# Patient Record
Sex: Female | Born: 1935 | Race: White | Hispanic: No
Health system: Southern US, Community
[De-identification: ages and names within clinical notes are randomized; demographics above are authoritative.]

---

## 2006-09-10 ENCOUNTER — Other Ambulatory Visit: Payer: Self-pay

## 2006-09-10 ENCOUNTER — Inpatient Hospital Stay: Payer: Self-pay | Admitting: Internal Medicine

## 2007-01-21 ENCOUNTER — Ambulatory Visit: Payer: Self-pay | Admitting: Family Medicine

## 2007-04-29 ENCOUNTER — Emergency Department: Payer: Self-pay | Admitting: Emergency Medicine

## 2007-04-29 ENCOUNTER — Other Ambulatory Visit: Payer: Self-pay

## 2007-05-07 ENCOUNTER — Other Ambulatory Visit: Payer: Self-pay

## 2007-05-07 ENCOUNTER — Emergency Department: Payer: Self-pay | Admitting: Emergency Medicine

## 2007-05-20 ENCOUNTER — Emergency Department: Payer: Self-pay | Admitting: Emergency Medicine

## 2007-05-20 ENCOUNTER — Other Ambulatory Visit: Payer: Self-pay

## 2007-07-11 ENCOUNTER — Ambulatory Visit: Payer: Self-pay | Admitting: Emergency Medicine

## 2007-12-03 ENCOUNTER — Ambulatory Visit: Payer: Self-pay | Admitting: Vascular Surgery

## 2008-02-29 ENCOUNTER — Ambulatory Visit: Payer: Self-pay | Admitting: Family Medicine

## 2008-05-02 ENCOUNTER — Ambulatory Visit: Payer: Self-pay | Admitting: Internal Medicine

## 2008-09-02 ENCOUNTER — Ambulatory Visit: Payer: Self-pay | Admitting: Internal Medicine

## 2009-12-18 ENCOUNTER — Ambulatory Visit: Payer: Self-pay | Admitting: Vascular Surgery

## 2011-08-29 ENCOUNTER — Ambulatory Visit: Payer: Self-pay | Admitting: Vascular Surgery

## 2011-08-29 LAB — CREATININE, SERUM
Creatinine: 0.88 mg/dL (ref 0.60–1.30)
EGFR (African American): >60
EGFR (Non-African Amer.): >60

## 2014-01-03 ENCOUNTER — Telehealth: Payer: Self-pay

## 2014-01-03 NOTE — Telephone Encounter (Signed)
Pt's daughter called in regards to a hospital bed for pt.

## 2014-01-05 NOTE — Telephone Encounter (Signed)
This was entered into the incorrect Larey Dresserellie Taunton' chart in error.

## 2014-01-06 NOTE — Telephone Encounter (Signed)
OK to write order for hospital bed for patient.  Dx: low back pain with DDD lumbar spine, deconditioning, frequent falls, peripheral neuropathy.  Where do we need to send order?  [facility]?

## 2014-01-08 ENCOUNTER — Telehealth: Payer: Self-pay | Admitting: Radiology

## 2014-01-08 NOTE — Telephone Encounter (Signed)
error 

## 2014-01-08 NOTE — Telephone Encounter (Signed)
See other phone message  

## 2014-07-07 ENCOUNTER — Ambulatory Visit: Payer: Self-pay | Admitting: Hospice and Palliative Medicine

## 2014-07-26 LAB — COMPREHENSIVE METABOLIC PANEL
AST: 51 U/L — AB (ref 15–37)
Albumin: 3.4 g/dL (ref 3.4–5.0)
Alkaline Phosphatase: 119 U/L — ABNORMAL HIGH
Anion Gap: 9 (ref 7–16)
BUN: 35 mg/dL — AB (ref 7–18)
Bilirubin,Total: 0.4 mg/dL (ref 0.2–1.0)
CALCIUM: 9.5 mg/dL (ref 8.5–10.1)
Chloride: 112 mmol/L — ABNORMAL HIGH (ref 98–107)
Co2: 26 mmol/L (ref 21–32)
Creatinine: 1.7 mg/dL — ABNORMAL HIGH (ref 0.60–1.30)
GFR CALC AF AMER: 36 — AB
GFR CALC NON AF AMER: 30 — AB
GLUCOSE: 205 mg/dL — AB (ref 65–99)
OSMOLALITY: 306 (ref 275–301)
POTASSIUM: 4.4 mmol/L (ref 3.5–5.1)
SGPT (ALT): 34 U/L
Sodium: 147 mmol/L — ABNORMAL HIGH (ref 136–145)
Total Protein: 7.2 g/dL (ref 6.4–8.2)

## 2014-07-26 LAB — CBC
HCT: 49.7 % — ABNORMAL HIGH (ref 35.0–47.0)
HGB: 15.6 g/dL (ref 12.0–16.0)
MCH: 30.3 pg (ref 26.0–34.0)
MCHC: 31.3 g/dL — ABNORMAL LOW (ref 32.0–36.0)
MCV: 97 fL (ref 80–100)
PLATELETS: 217 10*3/uL (ref 150–440)
RBC: 5.13 10*6/uL (ref 3.80–5.20)
RDW: 14.4 % (ref 11.5–14.5)
WBC: 15.7 10*3/uL — AB (ref 3.6–11.0)

## 2014-07-26 LAB — URINALYSIS, COMPLETE
BLOOD: NEGATIVE
Bilirubin,UR: NEGATIVE
Glucose,UR: NEGATIVE mg/dL (ref 0–75)
KETONE: NEGATIVE
Leukocyte Esterase: NEGATIVE
NITRITE: NEGATIVE
PH: 6 (ref 4.5–8.0)
RBC,UR: NONE SEEN /HPF (ref 0–5)
Specific Gravity: 1.018 (ref 1.003–1.030)
Squamous Epithelial: 1

## 2014-07-26 LAB — LIPASE, BLOOD: LIPASE: 410 U/L — AB (ref 73–393)

## 2014-07-27 ENCOUNTER — Inpatient Hospital Stay: Payer: Self-pay | Admitting: Internal Medicine

## 2014-07-27 LAB — CLOSTRIDIUM DIFFICILE(ARMC)

## 2014-07-28 LAB — CBC WITH DIFFERENTIAL/PLATELET
BASOS PCT: 0.4 %
Basophil #: 0 10*3/uL (ref 0.0–0.1)
EOS ABS: 0 10*3/uL (ref 0.0–0.7)
EOS PCT: 0.6 %
HCT: 37.2 % (ref 35.0–47.0)
HGB: 11.9 g/dL — AB (ref 12.0–16.0)
LYMPHS PCT: 16.3 %
Lymphocyte #: 1.1 10*3/uL (ref 1.0–3.6)
MCH: 30.4 pg (ref 26.0–34.0)
MCHC: 31.9 g/dL — ABNORMAL LOW (ref 32.0–36.0)
MCV: 95 fL (ref 80–100)
Monocyte #: 0.5 x10 3/mm (ref 0.2–0.9)
Monocyte %: 7.1 %
NEUTROS PCT: 75.6 %
Neutrophil #: 5.2 10*3/uL (ref 1.4–6.5)
Platelet: 128 10*3/uL — ABNORMAL LOW (ref 150–440)
RBC: 3.9 10*6/uL (ref 3.80–5.20)
RDW: 14.3 % (ref 11.5–14.5)
WBC: 6.8 10*3/uL (ref 3.6–11.0)

## 2014-07-28 LAB — COMPREHENSIVE METABOLIC PANEL
ALBUMIN: 2.4 g/dL — AB (ref 3.4–5.0)
ALK PHOS: 77 U/L
Anion Gap: 7 (ref 7–16)
BUN: 33 mg/dL — AB (ref 7–18)
Bilirubin,Total: 0.2 mg/dL (ref 0.2–1.0)
CREATININE: 1.05 mg/dL (ref 0.60–1.30)
Calcium, Total: 8.2 mg/dL — ABNORMAL LOW (ref 8.5–10.1)
Chloride: 114 mmol/L — ABNORMAL HIGH (ref 98–107)
Co2: 25 mmol/L (ref 21–32)
EGFR (African American): >60
EGFR (Non-African Amer.): 52 — ABNORMAL LOW
Glucose: 69 mg/dL (ref 65–99)
OSMOLALITY: 296 (ref 275–301)
POTASSIUM: 3.7 mmol/L (ref 3.5–5.1)
SGOT(AST): 44 U/L — ABNORMAL HIGH (ref 15–37)
SGPT (ALT): 24 U/L
Sodium: 146 mmol/L — ABNORMAL HIGH (ref 136–145)
Total Protein: 5.2 g/dL — ABNORMAL LOW (ref 6.4–8.2)

## 2014-07-29 LAB — CREATININE, SERUM
CREATININE: 0.82 mg/dL (ref 0.60–1.30)
EGFR (African American): >60
EGFR (Non-African Amer.): >60

## 2014-07-29 LAB — BUN: BUN: 22 mg/dL — AB (ref 7–18)

## 2014-07-29 LAB — PLATELET COUNT: Platelet: 152 10*3/uL (ref 150–440)

## 2014-07-30 LAB — URINALYSIS, COMPLETE
Bilirubin,UR: NEGATIVE
Glucose,UR: NEGATIVE mg/dL (ref 0–75)
Nitrite: NEGATIVE
Ph: 5 (ref 4.5–8.0)
Protein: NEGATIVE
Specific Gravity: 1.01 (ref 1.003–1.030)
Squamous Epithelial: 1

## 2014-07-30 LAB — CBC WITH DIFFERENTIAL/PLATELET
BASOS ABS: 0 10*3/uL (ref 0.0–0.1)
Basophil %: 0.3 %
Eosinophil #: 0 10*3/uL (ref 0.0–0.7)
Eosinophil %: 0.4 %
HCT: 40.6 % (ref 35.0–47.0)
HGB: 13.2 g/dL (ref 12.0–16.0)
LYMPHS PCT: 11.1 %
Lymphocyte #: 0.9 10*3/uL — ABNORMAL LOW (ref 1.0–3.6)
MCH: 30.1 pg (ref 26.0–34.0)
MCHC: 32.4 g/dL (ref 32.0–36.0)
MCV: 93 fL (ref 80–100)
Monocyte #: 0.6 x10 3/mm (ref 0.2–0.9)
Monocyte %: 7.9 %
NEUTROS ABS: 6.1 10*3/uL (ref 1.4–6.5)
Neutrophil %: 80.3 %
Platelet: 159 10*3/uL (ref 150–440)
RBC: 4.37 10*6/uL (ref 3.80–5.20)
RDW: 13.4 % (ref 11.5–14.5)
WBC: 7.6 10*3/uL (ref 3.6–11.0)

## 2014-07-30 LAB — BASIC METABOLIC PANEL
Anion Gap: 13 (ref 7–16)
BUN: 12 mg/dL (ref 7–18)
CALCIUM: 8.7 mg/dL (ref 8.5–10.1)
CO2: 22 mmol/L (ref 21–32)
CREATININE: 0.58 mg/dL — AB (ref 0.60–1.30)
Chloride: 104 mmol/L (ref 98–107)
EGFR (African American): >60
EGFR (Non-African Amer.): >60
Glucose: 63 mg/dL — ABNORMAL LOW (ref 65–99)
Osmolality: 275 (ref 275–301)
POTASSIUM: 3.8 mmol/L (ref 3.5–5.1)
SODIUM: 139 mmol/L (ref 136–145)

## 2014-07-30 LAB — STOOL CULTURE

## 2014-08-04 LAB — URINE CULTURE

## 2014-08-07 ENCOUNTER — Ambulatory Visit: Payer: Self-pay | Admitting: Hospice and Palliative Medicine

## 2014-08-10 LAB — WBCS, STOOL

## 2014-08-20 ENCOUNTER — Ambulatory Visit: Payer: Self-pay | Admitting: Family Medicine

## 2014-11-05 NOTE — H&P (Signed)
PATIENT NAME:  Jill Oliver, Krimson L MR#:  [id number] DATE OF BIRTH:  [birth year]  DATE OF ADMISSION:  07/27/2014  REFERRING DOCTOR: Aneta MinsPhillip A. Scotty CourtStafford, MD  PRIMARY CARE PRACTITIONER: Burley SaverL. Katherine Bliss, MD  ADMITTING  DOCTOR: Crissie FiguresEdavally N. Courtnay Petrilla, MD   CHIEF COMPLAINTS:  1.  Nausea, vomiting, diarrhea of 1 day duration.  2.  Generalized weakness for the past 1 week.    HISTORY OF PRESENT ILLNESS: A 81 year old Caucasian female with a history of dementia wheelchair/bed bound on total care dependent, history of hypertension, peripheral arterial disease, osteoarthritis, hyperlipidemia, questionable COPD was brought to the Emergency Room with the complaints of nausea, vomiting, and diarrhea of 1 day duration. According to the patient's daughter and patient's granddaughter, who are with the patient at this time, the patient developed nausea, vomiting and diarrhea of 1 day duration and, of late, for the past 1 week, she has been having generalized weakness with decreased responsiveness. Hence, brought to the Emergency Room. No history of any fever. No chest pain. No shortness of breath. No cough. In the Emergency Room, the patient was evaluated by the ED physician and workup revealed elevated sodium of 147 and a creatinine of 1.7, up from baseline of 0.8. The patient underwent chest x-ray, which was negative for any acute cardiopulmonary disease. The patient also underwent a CT of the head, which was negative for any acute CNS pathology, and also underwent a CT of the abdomen and pelvis, which was negative for any acute intraabdominal pathology. Incidentally, the patient was noted to have a left breast mass of 5 cm on her left breast and also some cholelithiasis without any cholecystitis and diverticulosis without any diverticulitis. The patient has a baseline dementia and is minimally communicative and basically wheelchair/bed bound and on total care dependent. She lives at home and being cared by home health. In the  Emergency Room, the patient was also noted to have hypoxia of unknown reason and a CT of the chest negative for any acute pulmonary pathology. She carries a diagnosis of questionable COPD and the CT of the chest shows some mucous plugging. She is on oxygen supplementation and currently her oxygen saturations are maintaining around 94% with 2 L of oxygen.   PAST MEDICAL HISTORY:  1.  Dementia.  2.  Hypertension.  3.  Peripheral artery disease.  4.  Osteoarthritis.  5.  Macular degeneration.   PAST SURGICAL HISTORY: Status post appendectomy.   ALLERGIES: No known drug allergies.   FAMILY HISTORY: Not known since the patient is an adopted child.   SOCIAL HISTORY: She lives at home by herself, close to her daughter's house and she is being helped by home health nurse 24 hours a day. She is basically wheelchair/bed bound. No history of alcohol usage or substance abuse. History of smoking in the past, but quit many years ago.    HOME MEDICATIONS:  1.  Aspirin 81 mg tablet 1 tablet orally once a day.  2.  Atenolol 50 mg tablet 1 tablet orally once a day.  3.  Citracal 1 tablet orally once a day.  4.  Enalapril 20 mg tablet 2 capsules orally once a day.  5.  Felodipine 10 mg tablet extended release 1 tablet orally once a day.  6.  Hydralazine 25 mg tablet orally 2 times a day.  7.  Lutein 6 mg oral capsule 1 capsule orally once a day.  8.  Polythene glycol 3350 orally as needed for constipation.  9.  Potassium chloride  extended release 10 mEq orally 1 tablet orally once a day.  10.  Trazodone 50 mg oral tablet 1 tablet orally once a day at bedtime.  11.  Vitamin B complex once a day.   REVIEW OF SYSTEMS: Unable to obtain since the patient has baseline dementia, but according to the patient's daughter and granddaughter, who are at the patient's bedside at this time, no history of any fever. No shortness of breath. No chest pain.   PHYSICAL EXAMINATION:  VITAL SIGNS: Temperature 97.3 degrees  Fahrenheit, pulse rate 86 per minute, respirations 20 per minute, blood pressure 101/46, oxygen saturation is 93% at room air.  GENERAL: Detailed examination not possible since the patient has a baseline dementia. She is minimally communicative. She is an elderly lady. She is awake and responding  to calling her name by opening her eyes, in no acute distress, comfortably resting in the bed.  HEAD: Atraumatic, normocephalic.  EYES: Pupils are equal, reacting to light. No conjunctival pallor. No scleral icterus.  NOSE: No drainage. No lesions.  EARS: No drainage. No external lesions.  ORAL CAVITY: No mucosal lesions. No exudates. Dry oral mucous membranes.  NECK: Supple. No JVD. No thyromegaly. No carotid bruit. Range of motion of neck within normal limits.  RESPIRATORY: Bilateral air entry present. Not using accessory muscles of respiration. No rales or rhonchi.  CARDIOVASCULAR: S1, S2 regular. A systolic murmur present. Peripheral pulses equal at carotid, femoral, and pedal pulses. No peripheral edema.  GASTROINTESTINAL: Abdomen is soft and nontender. No hepatosplenomegaly. No masses. No rigidity. No guarding. Bowel sounds present and equal in all 4 quadrants.  GENITOURINARY: Deferred.  MUSCULOSKELETAL: No joint tenderness or effusion. Range of motion is adequate in all areas. Strength and tone equal bilaterally.  SKIN: Inspection within normal limits.  LYMPHATIC: No cervical lymphadenopathy.  VASCULAR: Good dorsalis pedis and posterior tibial pulses.  NEUROLOGICAL: The patient has a baseline dementia. Detailed examination is not possible. She is awake, responding to her name by opening her eyes and minimally communicative because of her dementia. Moving all 4 limbs equal. No facial asymmetry.   PSYCHIATRIC: Examination is not able to perform because of baseline dementia.   ANCILLARY DATA:  LABORATORY DATA: Serum glucose 205, BUN 35, creatinine 1.70, sodium 147, potassium 4.4, chloride 112,  bicarbonate 26, total calcium 9.5, lipase 410, total protein 7.2, albumin 3.4, total bilirubin 0.4, alkaline phosphatase 119, AST 51, ALT 34. WBC 15.7, hemoglobin 15.6, hematocrit 49.7, platelet count 217,000.  URINALYSIS: WBC 3, bacteria trace, nitrite negative, leukocyte esterase negative.   IMAGING STUDIES:  1.  Chest x-ray:  1.  No acute cardiopulmonary disease.  2.  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 No evidence of aneurysm rupture.  2.  CT of the head, noncontrast study: No acute intracranial abnormalities. Atrophy and moderate chronic microvascular ischemic changes.  3.  CT of the abdomen and the pelvis without contrast:  1.  No acute intra-abdominal findings to explain pain. There is cholelithiasis, but no evidence of acute cholecystitis.  2.  Large lobulated left breast mass consistent with malignancy.  3.  Severe atherosclerosis.   EKG: Normal sinus rhythm with ventricular rate of 87 beats per minute.   ASSESSMENT AND PLAN: A 81 year old Caucasian female with a past medical history of dementia wheelchair/bed bound on total care dependent, history of hypertension, peripheral arterial disease brought with the complaints of nausea, vomiting, and diarrhea of 1 day duration and generalized weakness of 1 week  found to be dehydrated and also found to be with acute kidney injury with a creatinine of 1.7. The patient also noted to have hypoxia, likely secondary due to her chronic obstructive pulmonary disease versus bronchial plugging with mucus.  1.  Nausea, vomiting and diarrhea of 1 day duration, likely viral gastroenteritis. CT of the abdomen and the pelvis negative for any acute intra-abdominal pathology.  PLAN: Admit to medical floor, intravenous hydration and intravenous Zofran for nausea and vomiting control. Check stool studies to rule out any infectious causes and follow up clinically.  2.  Dehydration likely secondary due to poor oral  intake.  PLAN: Intravenous hydration. Follow up labs and urine output, and clinically.  3.  Acute kidney and kidney injury with a creatinine of 1.7 up from baseline of 0.8, likely secondary due to dehydration.  PLAN: Intravenous hydration. Follow up BMP. Avoid nephrotoxic agents.  4.  Hypoxia. Cause not clear at this time. The patient has a history of mild chronic obstructive pulmonary disease, but not on any inhalers at home. CT of the chest negative for acute pathology but has mucus plugging, likely the cause of hypoxia and bronchospasm versus chronic obstructive pulmonary disease versus mucus plugging. The patient not in any respiratory distress.  PLAN: DuoNebs as needed. Oxygen supplementation and monitor. Consider further workup as needed.  5.  Dementia on wheelchair/bed bound, minimally communicative on total care dependent. CT head negative for any acute intracranial pathology.  PLAN: Monitor and continue supportive care.  6.  Hypertension. The patient on multiple antihypertensive medications. We hold off ACE inhibitors because of acute kidney injury. Continue other medications. Follow blood pressure monitoring closely.  7.  Peripheral artery disease, stable clinically.  8.  Left breast mass found on CT of the chest concerning for possible malignancy. Needs further evaluation as outpatient.  9.  Deep vein thrombosis prophylaxis: Subcutaneous heparin.  10.  Gastrointestinal prophylaxis: Proton pump inhibitor.   CODE STATUS: Discussed with the patient's daughter the patient's status and that she is not aware of any living will at this time and her sister takes care of for her. At this current time, she full code. She is going to get back to [place] after checking with her other sister.   TIME SPENT: 55 minutes.    ____________________________ Crissie Figures, MD enr:bm D: 07/27/2014 00:54:29 ET T: 07/27/2014 01:32:27 ET JOB#: [id number]  cc: Crissie Figures, MD, <Dictator> Burley Saver, MD Crissie Figures MD ELECTRONICALLY SIGNED 07/28/2014 2:10

## 2014-11-05 NOTE — Discharge Summary (Signed)
PATIENT NAME:  Jill Oliver, Jill Oliver MR#:  [id number] DATE OF BIRTH:  February 26, 1921  DATE OF ADMISSION:  07/27/2014 DATE OF DISCHARGE:  08/01/2014   DISCHARGE DIAGNOSES:  1.  Nausea, vomiting, and diarrhea, likely related to viral gastroenteritis.  2.  Acute delirium with underlying dementia.   3.  Possible urinary tract infection based on urinalysis. Marland Kitchen  4.  Acute renal failure secondary to dehydration, prerenal in nature, resolved.   SECONDARY DIAGNOSES:  1.  Dementia.  2.  Hypertension.  3.  Peripheral artery disease.  4.  Osteoarthritis.  5.  Macular degeneration.    CONSULTATION:  1.  Physical and speech therapy. 2.  Palliative care, Ned Grace, MD   PROCEDURES AND RADIOLOGY: A chest x-ray on 08/03/2014 showed no acute cardiopulmonary disease. CT scan of the head without contrast on 08/03/2014 showed no acute intracranial abnormality, atrophy and moderate chronic microvascular ischemic changes.   CT scan of the abdomen and pelvis without contrast on 07/26/2014 showed no acute intra-abdominal findings. Cholelithiasis present, but no evidence of acute cholecystitis. Large lobulated left breast mass, consistent with malignancy. Severe atherosclerosis.    MAJOR LABORATORY PANEL: Urinalysis on admission was negative. Stool culture was negative on 07/27/2014. Urinalysis repeated on 07/30/2014 showed 1+ bacteria, 3 WBCs, trace leukocyte esterase.   HISTORY AND SHORT HOSPITAL COURSE: The patient is a 81 year old female with the above-mentioned medical problems, who was admitted for nausea, vomiting and diarrhea thought to be due to an acute viral gastroenteritis in nature.  Please see Dr. Reddy's dictated history and physical for further details. The patient was slowly improving with IV hydration. A palliative care consultation was obtained with Dr. Harriett Sine Phifer, who had a discussion with family considering multiple comorbidities and advanced  age. The patient was also known to have advanced  dementia, along with wheelchair-bound status. Family did agree with making the patient DNR and likely have palliative care follow while at the facility. Eventually she may need hospice services involved while at the facility.   During the inpatient course, the patient was found to have a left breast mass, which was likely consistent with malignancy, but the family opted not to undergo further work-up for this. The patient was evaluated by physical and speech therapy, who recommended short-term rehabilitation where she is being discharged. Her nausea, vomiting and diarrhea has resolved. All of her workup has remained negative. She remains in fair condition and is being discharged to [facility] today.   VITAL SIGNS: On the date of discharge, her vital signs were as follows: Temperature 97.3, heart rate 75 per minute, respirations 18 per minute, blood pressure 122/64. She is saturating 95% on room air.    PERTINENT PHYSICAL EXAMINATION ON THE DAY OF DISCHARGE: CARDIOVASCULAR: S1, S2 normal. No murmurs, rubs, or gallops.  LUNGS: Clear to auscultation bilaterally. No wheezing, rales, rhonchi, crepitation.  ABDOMEN: Soft, benign.  NEUROLOGIC: She remains awake, but confused. She has minimal interaction with family. HEENT:  She has significant temporal wasting. Overall, she looks cachectic.  All other physical examination remained at baseline.   DISCHARGE MEDICATIONS:   Medication Instructions  felodipine 10 mg oral tablet, extended release  1  orally once a day    atenolol 50 mg oral tablet  1  orally once a day    enalapril 20 mg oral tablet  2 tab(s) orally once a day    vitamin b compound strong    once a day    trazodone 50 mg oral tablet  1  orally once a day (at bedtime)    aspirin low strength    once a day    polyethylene glycol 3350   orally    potassium chloride extended release 10 meq oral tablet, extended release   orally once a day   hydralazine 25 mg oral tablet   orally 2 times  a day   lutein 6 mg oral capsule  1 cap(s) orally once a day   cephalexin 250 mg oral capsule  1 cap(s) orally every 12 hours x 1 days     DISCHARGE DIET: Low sodium, pureed with honey-thick liquids. Strict aspiration precautions to be followed. Medications in puree, crushed as able. Feeding assistance at all meals. Dietary supplements as ordered by dietitian or MD.   DISCHARGE ACTIVITY: As tolerated.   DISCHARGE INSTRUCTIONS AND FOLLOWUP: The patient was instructed to follow up with her primary care physician, Dr. Clayborn BignessKatherine Bliss, in 1-2 weeks. She will be followed by palliative care/hospice while at the facility, at [facility].   TOTAL TIME DISCHARGING THIS PATIENT: 55 minutes.   CODE STATUS: DNR. She remains at very high risk for readmission.    ____________________________ Ellamae SiaVipul S. Sherryll BurgerShah, MD vss:MT D: 08/01/2014 15:04:59 ET T: 08/01/2014 15:31:31 ET JOB#: [id number]  cc: Page Pucciarelli S. Sherryll BurgerShah, MD, <Dictator> Burley SaverL. Katherine Bliss, MD Ned GraceNancy Phifer, MD [facility] Ellamae SiaVIPUL S [facility] MD ELECTRONICALLY SIGNED 08/02/2014 14:31

## 2014-11-05 DEATH — deceased

## 2015-08-05 IMAGING — CT CT ABD-PELV W/O CM
2 of 4 series · 15 of 46 positions shown, 17 images · non-contrast
Comparison: None.

CLINICAL DATA: Epigastric pain with nausea, vomiting, diarrhea.

EXAM:
CT ABDOMEN AND PELVIS WITHOUT CONTRAST
TECHNIQUE: Multidetector CT imaging of the abdomen and pelvis was performed
following the standard protocol without IV contrast.

[Series 2: routine abd pel without · axial · non-contrast · 0.63mm/px · z∈[-376,-46]mm · 12 of 79 slices shown, 14 images]
[im 7/79  soft-tissue]
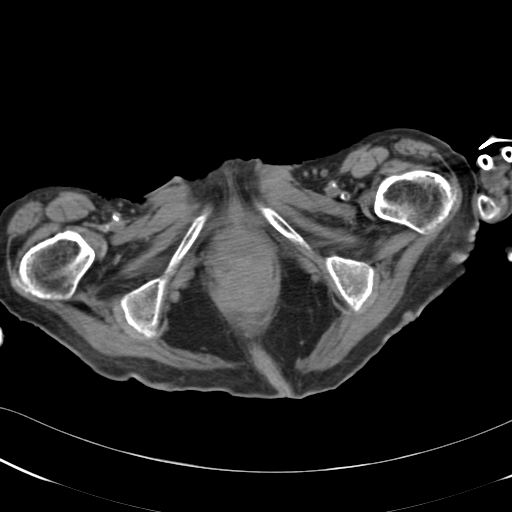
[im 7/79  bone]
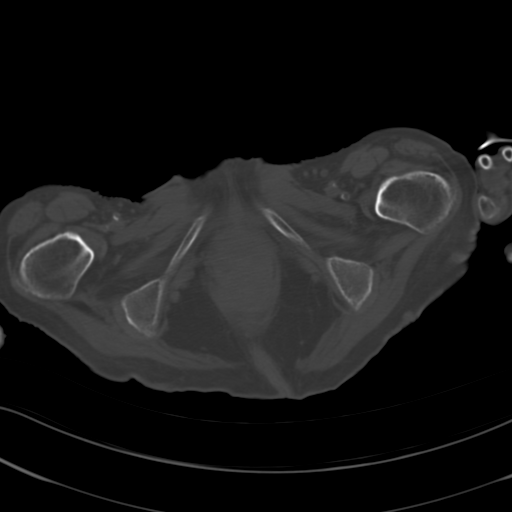
[im 13/79  soft-tissue]
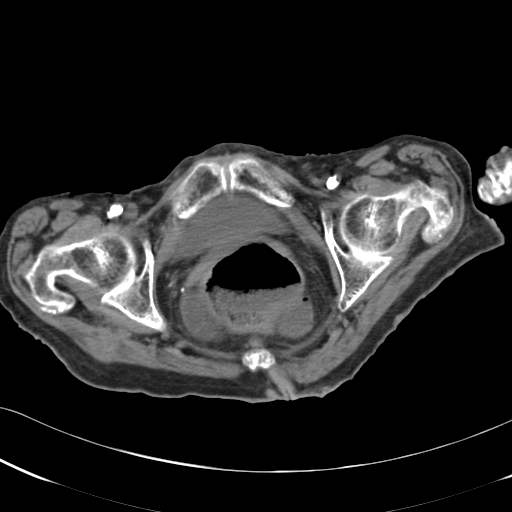
[im 19/79  soft-tissue]
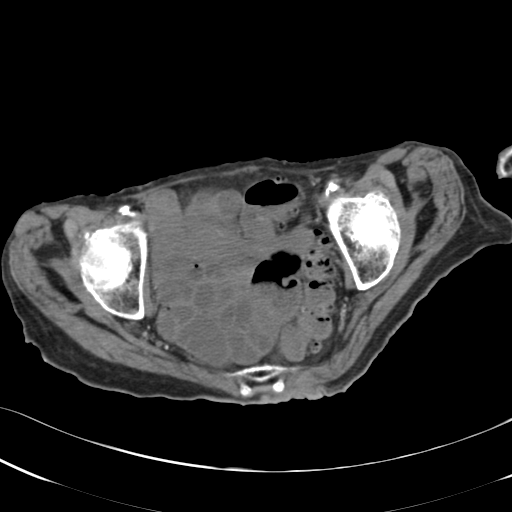
[im 25/79  soft-tissue]
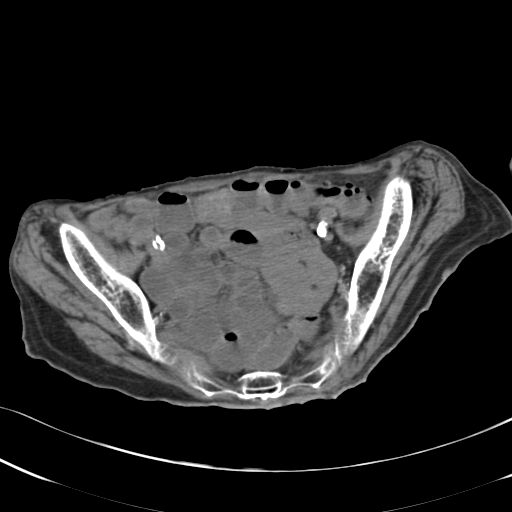
[im 31/79  soft-tissue]
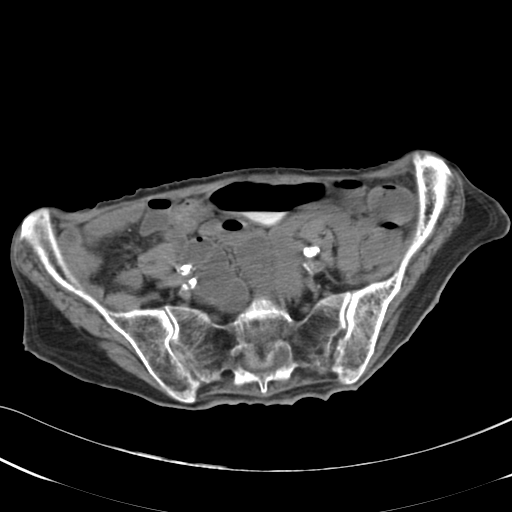
[im 37/79  soft-tissue]
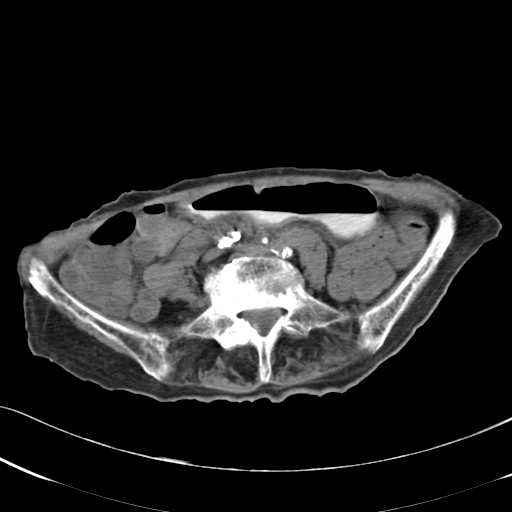
[im 43/79  soft-tissue]
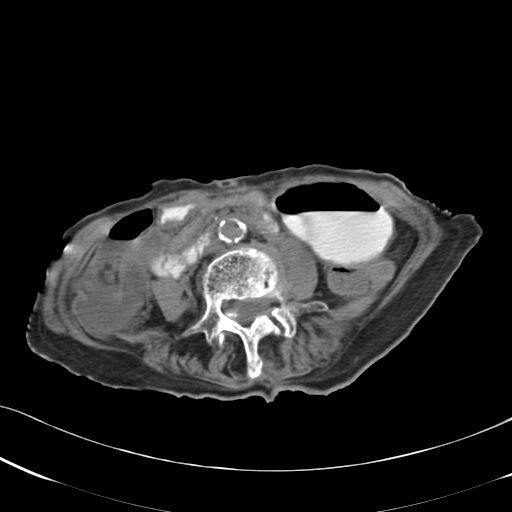
[im 49/79  soft-tissue]
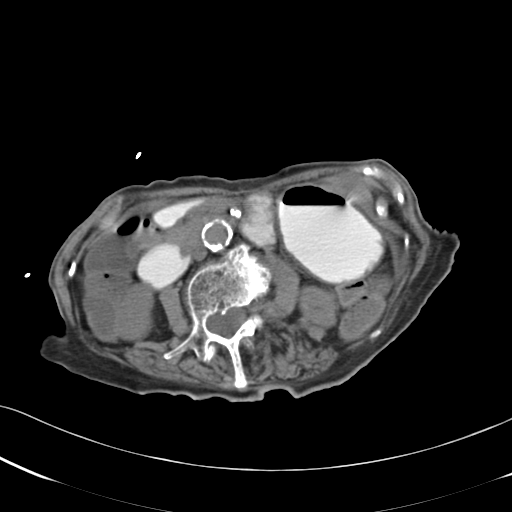
[im 55/79  soft-tissue]
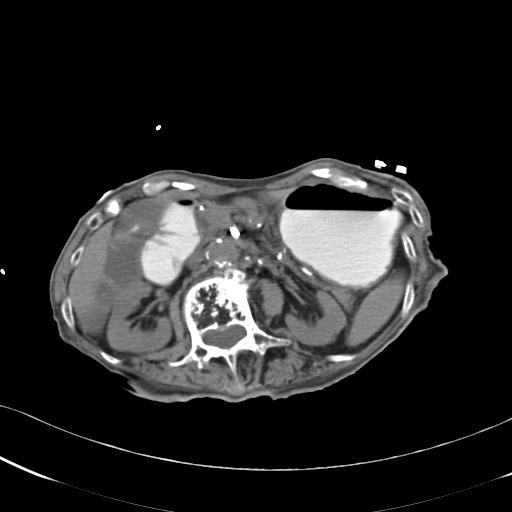
[im 55/79  bone]
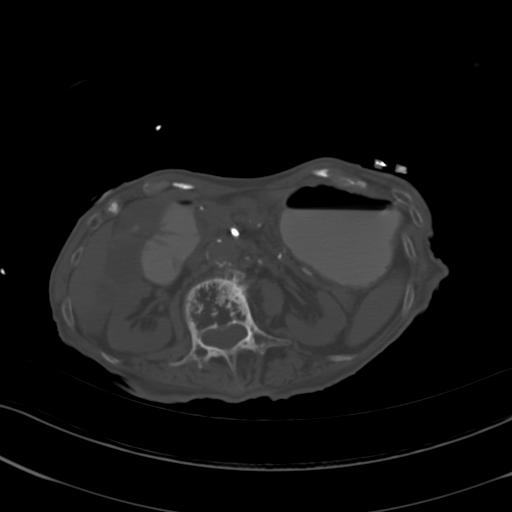
[im 61/79  soft-tissue]
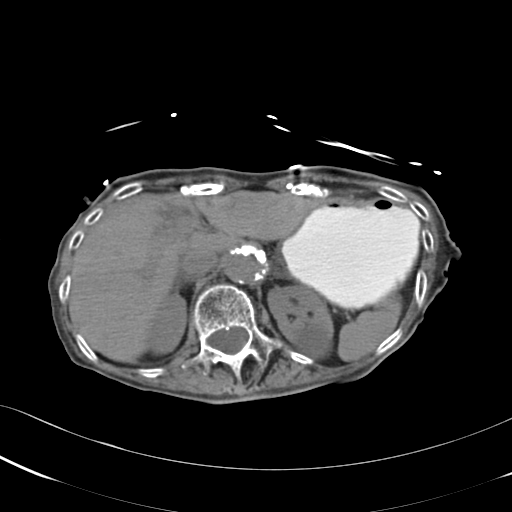
[im 67/79  soft-tissue]
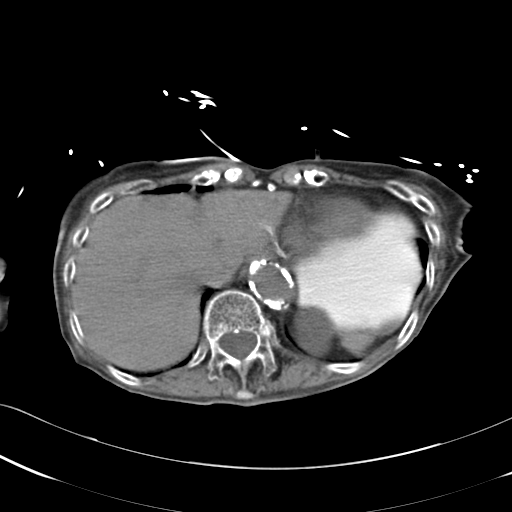
[im 73/79  soft-tissue]
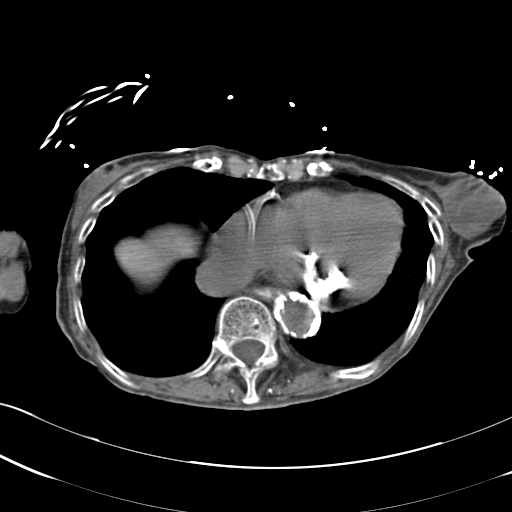

[Series 5: cor routine abd pel wo · coronal · 0.61mm/px · 3 of 85 slices shown]
[im 29/85  soft-tissue]
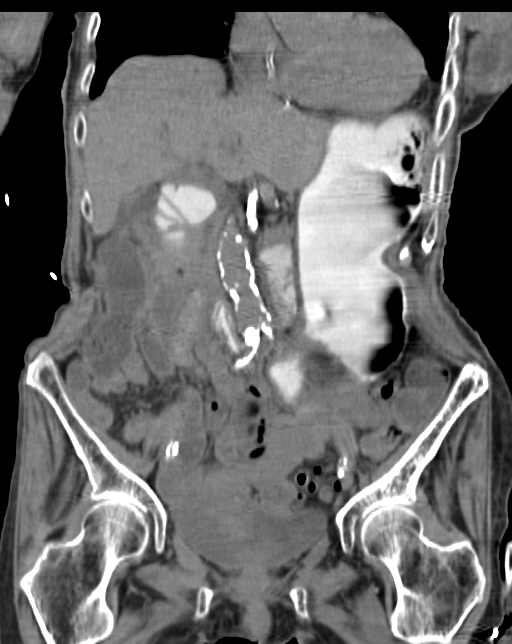
[im 38/85  soft-tissue]
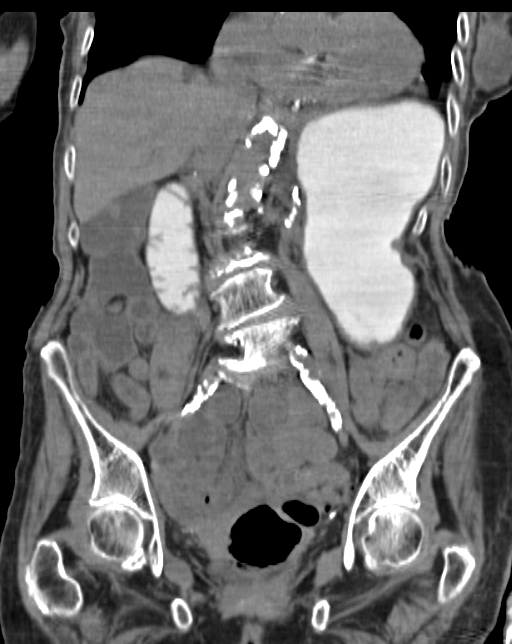
[im 47/85  soft-tissue]
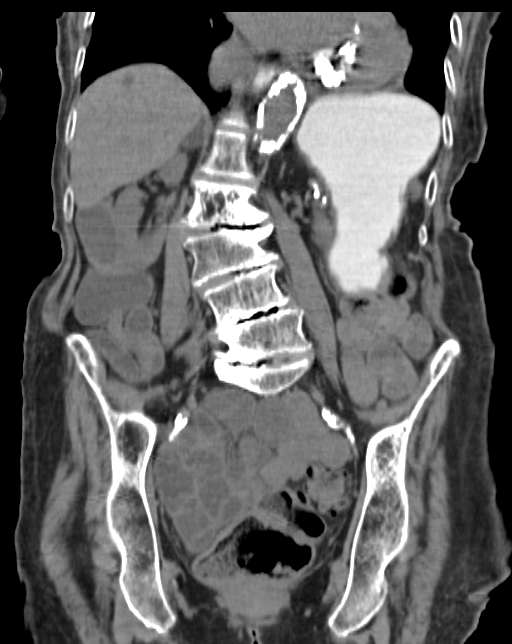

[15 of 46 positions shown; findings below may reference images not displayed]

FINDINGS: BODY WALL: There is a bilobed, partly visualized mass within the
lower left breast which is new or enlarged from 8007. At and minimum
it measures 5 cm.

LOWER CHEST: There is bronchial wall thickening and scattered mucoid
impaction that is chronic based on chest CT imaging from 8007.

ABDOMEN/PELVIS:

Liver: Scattered circumscribed low attenuating foci in the liver
likely reflects cysts. Those in the upper right lobe were present on
comparison chest CT in stable.

Biliary: Cholelithiasis. No findings to suggest acute cholecystitis.

Pancreas: Unremarkable.

Spleen: Unremarkable.

Adrenals: There is thickening of the left adrenal gland which was
likely present previously. Low-density appearance compatible with
adenoma, 12 mm in maximal diameter.

Kidneys and ureters: 4 cm cyst in the upper pole left kidney. No
hydronephrosis or urolithiasis.

Bladder: Calcification along the posterior left bladder wall is
likely atherosclerotic rather than layering small stones given the
contralateral pelvis appearance.

Reproductive: Unremarkable.

Bowel: No bowel obstruction. Colonic diverticulosis without active
inflammation. Appendix is not clearly identified. There is no
secondary signs of appendicitis.

Retroperitoneum: No mass or adenopathy.

Peritoneum: No ascites or pneumoperitoneum.

Vascular: Severe atherosclerotic calcification of the aorta and
branch vessels. Bulky calcification of the proximal SFA bilaterally
and of the SMA suggest high-grade stenosis or occlusion.

OSSEOUS: The L2 vertebra has coarsened trabecula both of the body
and posterior elements with scattered sclerotic foci. The appearance
is stable from 3299 and likely reflects a large hemangioma. Lack of
cortical thickening argues against main differential consideration
of Paget's disease. Metastasis considered unlikely given stability.

There is lumbar dextroscoliosis with diffuse advanced degenerative
disc change.
IMPRESSION: 1. No acute intra-abdominal findings to explain pain. There is
cholelithiasis, but no evidence for acute cholecystitis.
2. Large lobulated left breast mass consistent with malignancy.
3. Severe atherosclerosis.

## 2015-08-05 IMAGING — CT CT HEAD WITHOUT CONTRAST
2 of 3 series · 16 of 30 positions shown, 19 images · non-contrast
Comparison: 05/07/2007

CLINICAL DATA: per EMS patient coming from home with c/o nausea,
vomiting and diarrhea onset today. when family arrived they also
report that for the past week they have noticed that patient has not
been as alert (she does have hx of dementia) and they have had to
prop her up with pillows to keep her from falling to the left side,
they also report that they have noticed that her left eye is staying
shut most of the time and that is a change for her.

EXAM:
CT HEAD WITHOUT CONTRAST
TECHNIQUE: Contiguous axial images were obtained from the base of the skull
through the vertex without intravenous contrast.

[Series 2: head wo · axial · 0.42mm/px · z∈[-190,-65]mm · 10 of 31 slices shown, 13 images]
[im 3/31  brain]
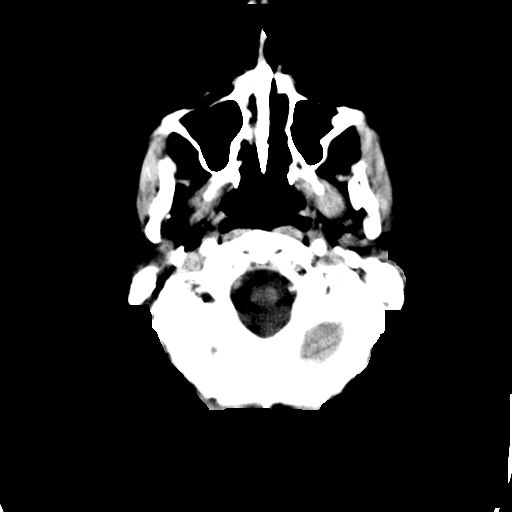
[im 3/31  bone]
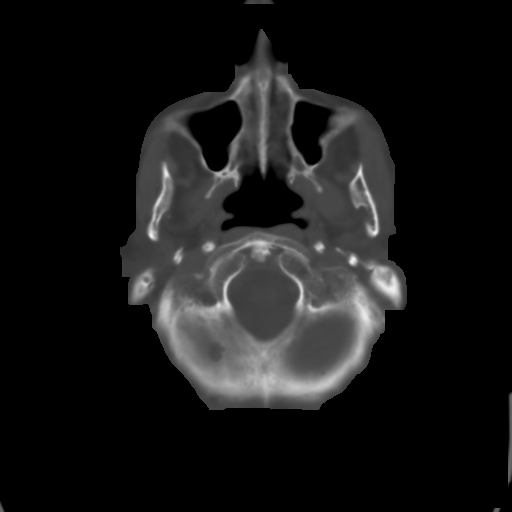
[im 6/31  brain]
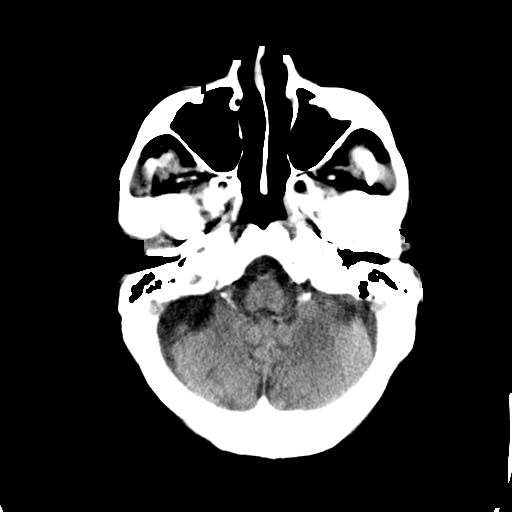
[im 9/31  brain]
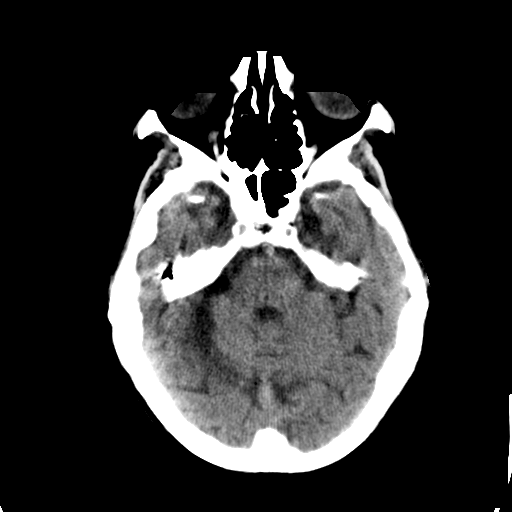
[im 11/31  brain]
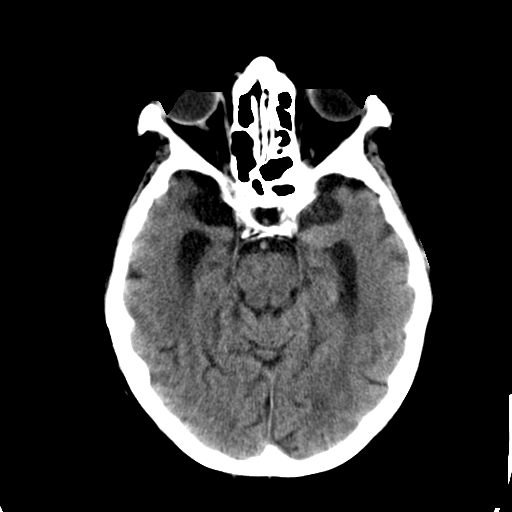
[im 14/31  brain]
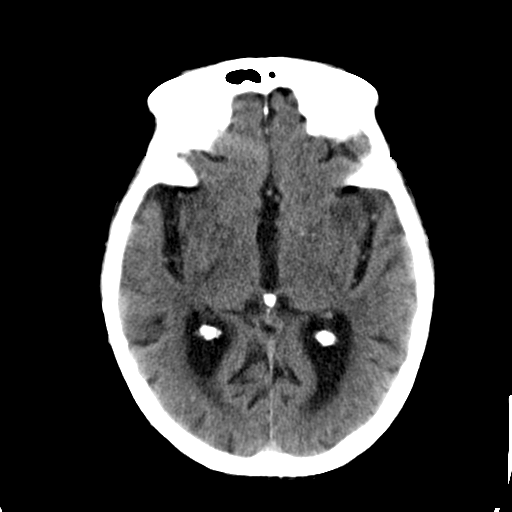
[im 14/31  bone]
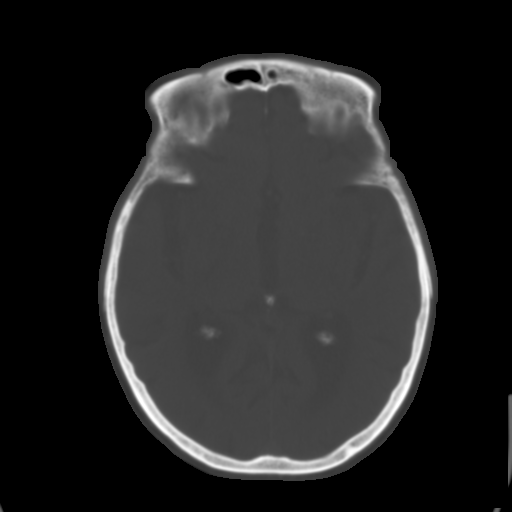
[im 17/31  brain]
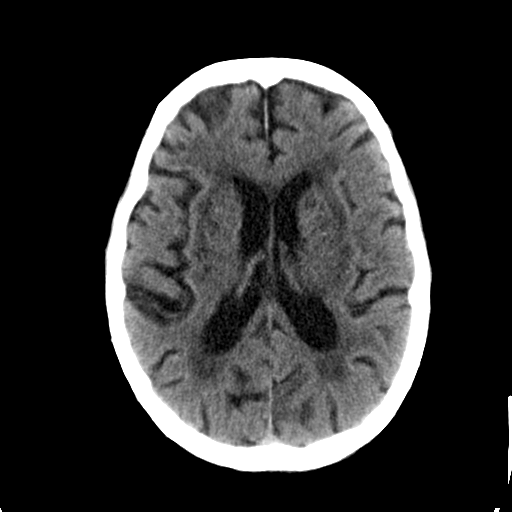
[im 20/31  brain]
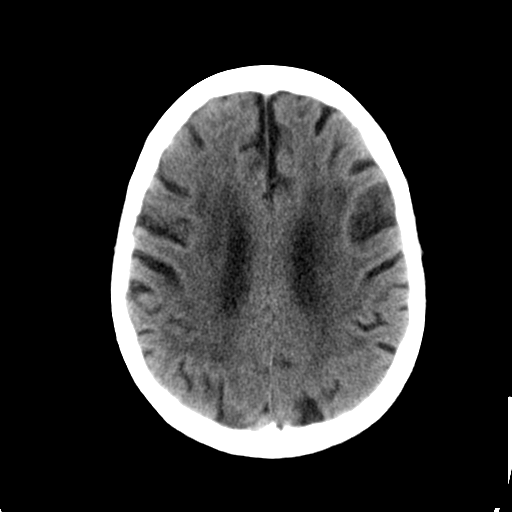
[im 22/31  brain]
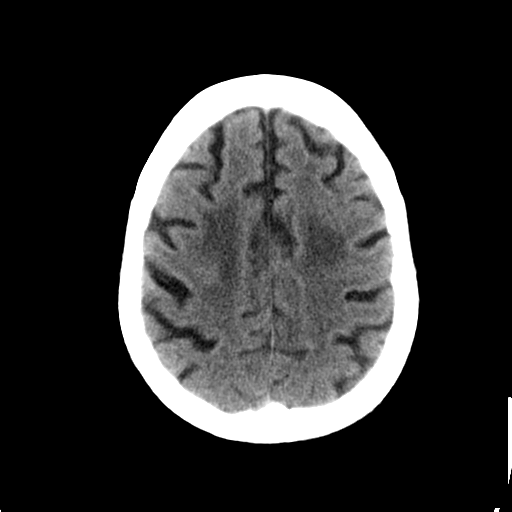
[im 25/31  brain]
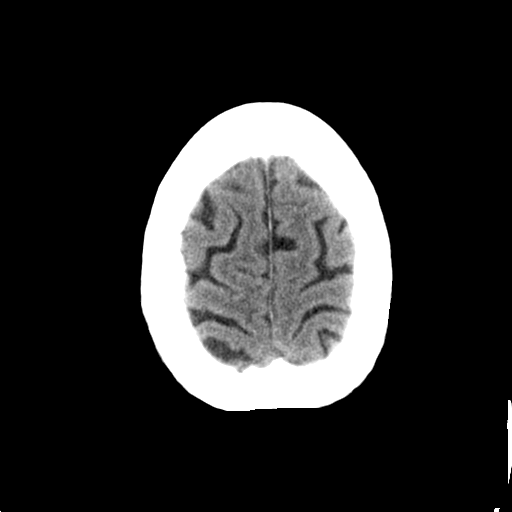
[im 25/31  bone]
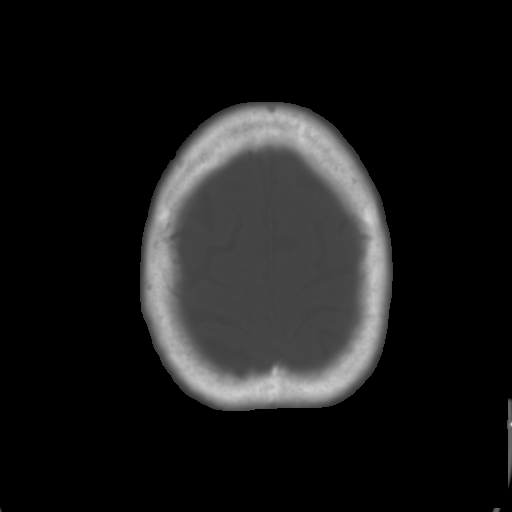
[im 28/31  brain]
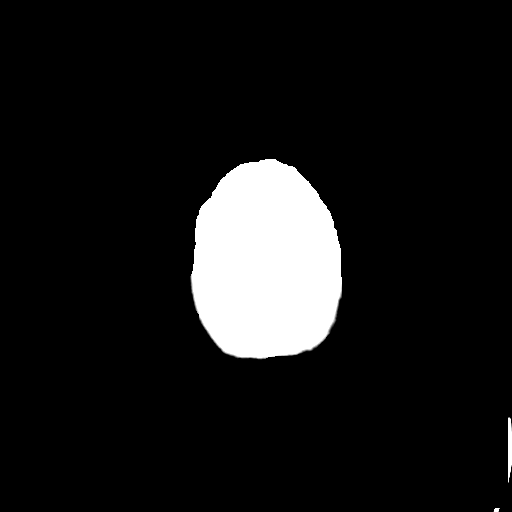

[Series 4: head wo recon · axial · 0.42mm/px · z∈[-116,-52]mm · 6 of 23 slices shown]
[im 3/23  brain]
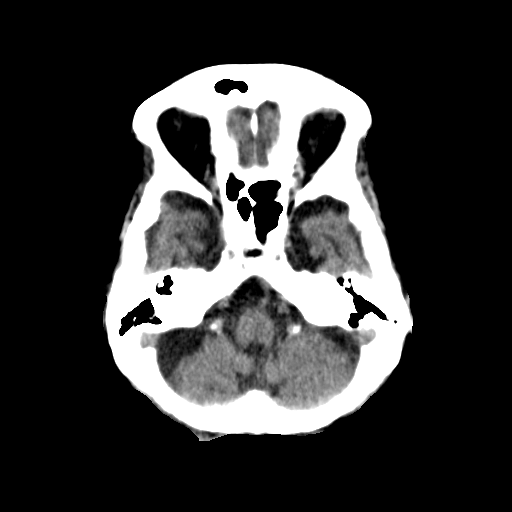
[im 6/23  brain]
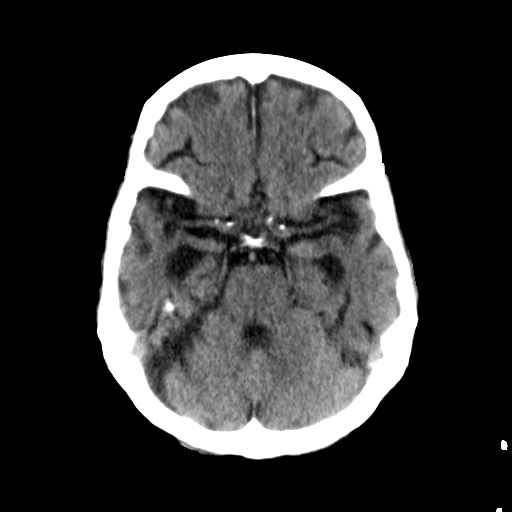
[im 9/23  brain]
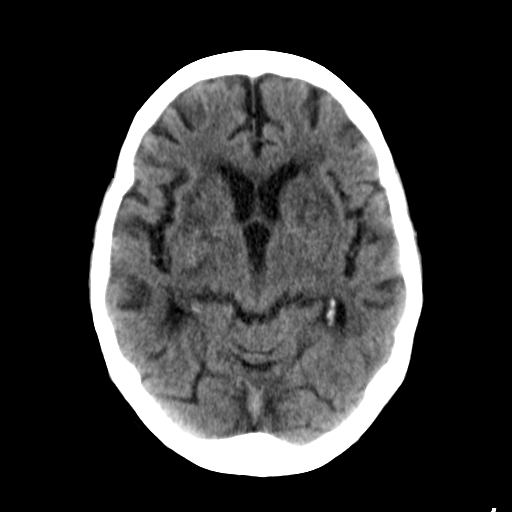
[im 12/23  brain]
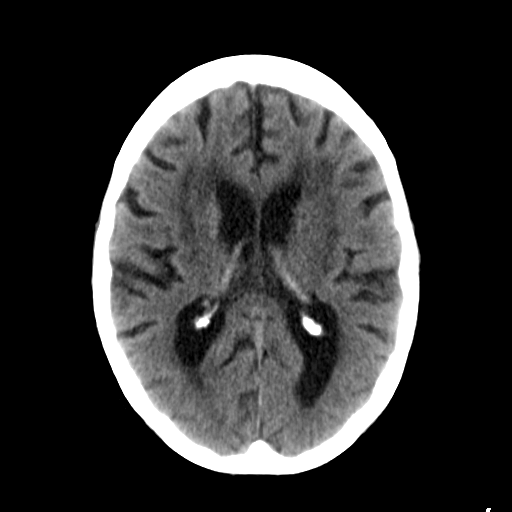
[im 14/23  brain]
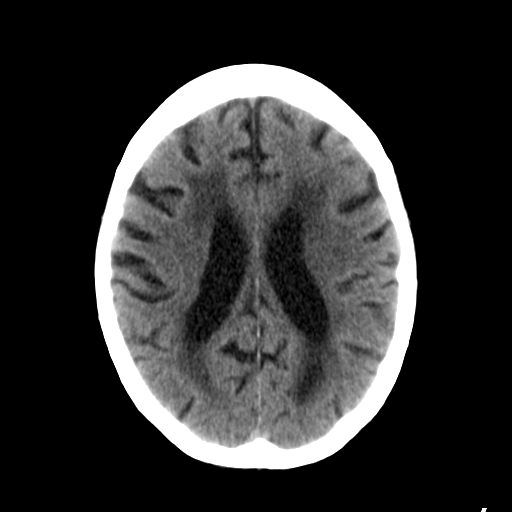
[im 17/23  brain]
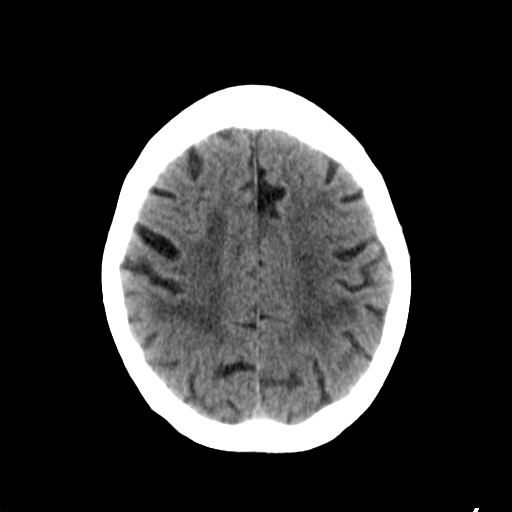

[16 of 30 positions shown; findings below may reference images not displayed]

FINDINGS: Ventricles are normal in configuration. There is ventricular and
sulcal enlargement reflecting moderate atrophy. No hydrocephalus.

There are no parenchymal masses or mass effect. There is no evidence
of a cortical infarct.

Patchy white matter hypoattenuation is noted consistent with
moderate chronic microvascular ischemic change.

No extra-axial masses or abnormal fluid collections.

There is no intracranial hemorrhage.

Sinus essentially clear as are the mastoid air cells. No skull
lesion.
IMPRESSION: 1. No acute intracranial abnormalities.
2. Atrophy and moderate chronic microvascular ischemic change.
